# Patient Record
Sex: Female | Born: 1975 | Race: White | Hispanic: No | Marital: Married | Smoking: Current some day smoker
Health system: Southern US, Community
[De-identification: ages and names within clinical notes are randomized; demographics above are authoritative.]

## PROBLEM LIST (undated history)

## (undated) DIAGNOSIS — J45909 Unspecified asthma, uncomplicated: Secondary | ICD-10-CM

## (undated) DIAGNOSIS — F32A Depression, unspecified: Secondary | ICD-10-CM

## (undated) DIAGNOSIS — F329 Major depressive disorder, single episode, unspecified: Secondary | ICD-10-CM

## (undated) DIAGNOSIS — L309 Dermatitis, unspecified: Secondary | ICD-10-CM

## (undated) HISTORY — PX: ABDOMINAL HYSTERECTOMY: SHX81

## (undated) HISTORY — PX: APPENDECTOMY: SHX54

## (undated) HISTORY — PX: CHOLECYSTECTOMY: SHX55

---

## 2014-07-20 ENCOUNTER — Emergency Department (HOSPITAL_COMMUNITY): Payer: Federal, State, Local not specified - PPO

## 2014-07-20 ENCOUNTER — Encounter (HOSPITAL_COMMUNITY): Payer: Self-pay | Admitting: Emergency Medicine

## 2014-07-20 ENCOUNTER — Emergency Department (HOSPITAL_COMMUNITY)
Admission: EM | Admit: 2014-07-20 | Discharge: 2014-07-20 | Disposition: A | Discharge status: Home / Self Care | Payer: Federal, State, Local not specified - PPO | Attending: Emergency Medicine | Admitting: Emergency Medicine

## 2014-07-20 DIAGNOSIS — Z79899 Other long term (current) drug therapy: Secondary | ICD-10-CM | POA: Insufficient documentation

## 2014-07-20 DIAGNOSIS — Z720 Tobacco use: Secondary | ICD-10-CM | POA: Diagnosis not present

## 2014-07-20 DIAGNOSIS — F329 Major depressive disorder, single episode, unspecified: Secondary | ICD-10-CM | POA: Diagnosis not present

## 2014-07-20 DIAGNOSIS — R0602 Shortness of breath: Secondary | ICD-10-CM

## 2014-07-20 DIAGNOSIS — Z789 Other specified health status: Secondary | ICD-10-CM

## 2014-07-20 DIAGNOSIS — Z7952 Long term (current) use of systemic steroids: Secondary | ICD-10-CM | POA: Diagnosis not present

## 2014-07-20 DIAGNOSIS — F419 Anxiety disorder, unspecified: Secondary | ICD-10-CM | POA: Diagnosis not present

## 2014-07-20 DIAGNOSIS — M542 Cervicalgia: Secondary | ICD-10-CM | POA: Diagnosis not present

## 2014-07-20 DIAGNOSIS — Z872 Personal history of diseases of the skin and subcutaneous tissue: Secondary | ICD-10-CM | POA: Diagnosis not present

## 2014-07-20 DIAGNOSIS — Z3202 Encounter for pregnancy test, result negative: Secondary | ICD-10-CM | POA: Diagnosis not present

## 2014-07-20 DIAGNOSIS — J45901 Unspecified asthma with (acute) exacerbation: Secondary | ICD-10-CM | POA: Diagnosis not present

## 2014-07-20 DIAGNOSIS — R079 Chest pain, unspecified: Secondary | ICD-10-CM

## 2014-07-20 DIAGNOSIS — Z880 Allergy status to penicillin: Secondary | ICD-10-CM | POA: Diagnosis not present

## 2014-07-20 HISTORY — DX: Unspecified asthma, uncomplicated: J45.909

## 2014-07-20 HISTORY — DX: Major depressive disorder, single episode, unspecified: F32.9

## 2014-07-20 HISTORY — DX: Dermatitis, unspecified: L30.9

## 2014-07-20 HISTORY — DX: Depression, unspecified: F32.A

## 2014-07-20 LAB — CBC
HEMATOCRIT: 43.8 % (ref 36.0–46.0)
Hemoglobin: 14.1 g/dL (ref 12.0–15.0)
MCH: 30.1 pg (ref 26.0–34.0)
MCHC: 32.2 g/dL (ref 30.0–36.0)
MCV: 93.4 fL (ref 78.0–100.0)
PLATELETS: 427 10*3/uL — AB (ref 150–400)
RBC: 4.69 MIL/uL (ref 3.87–5.11)
RDW: 13.4 % (ref 11.5–15.5)
WBC: 11.5 10*3/uL — AB (ref 4.0–10.5)

## 2014-07-20 LAB — POC URINE PREG, ED: PREG TEST UR: NEGATIVE

## 2014-07-20 LAB — URINALYSIS, ROUTINE W REFLEX MICROSCOPIC
Bilirubin Urine: NEGATIVE
Glucose, UA: NEGATIVE mg/dL
Ketones, ur: NEGATIVE mg/dL
Leukocytes, UA: NEGATIVE
Nitrite: NEGATIVE
PH: 5 (ref 5.0–8.0)
PROTEIN: NEGATIVE mg/dL
Specific Gravity, Urine: 1.026 (ref 1.005–1.030)
Urobilinogen, UA: 0.2 mg/dL (ref 0.0–1.0)

## 2014-07-20 LAB — URINE MICROSCOPIC-ADD ON

## 2014-07-20 LAB — BASIC METABOLIC PANEL
ANION GAP: 5 (ref 5–15)
BUN: 21 mg/dL (ref 6–23)
CHLORIDE: 108 meq/L (ref 96–112)
CO2: 26 mmol/L (ref 19–32)
CREATININE: 0.85 mg/dL (ref 0.50–1.10)
Calcium: 8.8 mg/dL (ref 8.4–10.5)
GFR calc non Af Amer: 87 mL/min — ABNORMAL LOW (ref >90)
Glucose, Bld: 100 mg/dL — ABNORMAL HIGH (ref 70–99)
POTASSIUM: 4 mmol/L (ref 3.5–5.1)
Sodium: 139 mmol/L (ref 135–145)

## 2014-07-20 LAB — RAPID URINE DRUG SCREEN, HOSP PERFORMED
Amphetamines: NOT DETECTED
Barbiturates: NOT DETECTED
Benzodiazepines: NOT DETECTED
Cocaine: NOT DETECTED
OPIATES: NOT DETECTED
Tetrahydrocannabinol: NOT DETECTED

## 2014-07-20 LAB — I-STAT TROPONIN, ED: Troponin i, poc: 0 ng/mL (ref 0.00–0.08)

## 2014-07-20 MED ORDER — IPRATROPIUM BROMIDE 0.02 % IN SOLN
0.5000 mg | Freq: Once | RESPIRATORY_TRACT | Status: AC
  Administered 2014-07-20: 0.5 mg via RESPIRATORY_TRACT
  Filled 2014-07-20: qty 2.5

## 2014-07-20 MED ORDER — LORAZEPAM 1 MG PO TABS: 0.5000 mg | ORAL_TABLET | Freq: Three times a day (TID) | ORAL | Status: AC | PRN

## 2014-07-20 MED ORDER — ALBUTEROL SULFATE (2.5 MG/3ML) 0.083% IN NEBU
5.0000 mg | INHALATION_SOLUTION | Freq: Once | RESPIRATORY_TRACT | Status: AC
  Administered 2014-07-20: 5 mg via RESPIRATORY_TRACT
  Filled 2014-07-20: qty 6

## 2014-07-20 MED ORDER — METHOCARBAMOL 500 MG PO TABS
500.0000 mg | ORAL_TABLET | Freq: Two times a day (BID) | ORAL | Status: DC
Start: 2014-07-20 — End: 2014-07-20

## 2014-07-20 MED ORDER — LORAZEPAM 2 MG/ML IJ SOLN
1.0000 mg | Freq: Once | INTRAMUSCULAR | Status: AC
  Administered 2014-07-20: 1 mg via INTRAVENOUS
  Filled 2014-07-20: qty 1

## 2014-07-20 MED ORDER — IOHEXOL 350 MG/ML SOLN
100.0000 mL | Freq: Once | INTRAVENOUS | Status: AC | PRN
  Administered 2014-07-20: 100 mL via INTRAVENOUS

## 2014-07-20 MED ORDER — METHOCARBAMOL 500 MG PO TABS: 500.0000 mg | ORAL_TABLET | Freq: Two times a day (BID) | ORAL | Status: AC

## 2014-07-20 MED ORDER — HYDROMORPHONE HCL 1 MG/ML IJ SOLN
1.0000 mg | Freq: Once | INTRAMUSCULAR | Status: AC
Start: 2014-07-20 — End: 2014-07-20
  Administered 2014-07-20: 1 mg via INTRAVENOUS
  Filled 2014-07-20: qty 1

## 2014-07-20 MED ORDER — LORAZEPAM 1 MG PO TABS: 0.5000 mg | ORAL_TABLET | Freq: Three times a day (TID) | ORAL | Status: DC | PRN

## 2014-07-20 NOTE — Discharge Instructions (Signed)
1. Medications: robaxin, ativan, usual home medications 2. Treatment: rest, drink plenty of fluids,  3. Follow Up: Please followup with your primary doctor as soon as she returned home for discussion of your diagnoses and further evaluation after today's visit; if you do not have a primary care doctor use the resource guide provided to find one; Please return to the ER for chest pain that is associated with diaphoresis, nausea, vomiting, syncope or other concerning symptoms.    Chest Pain (Nonspecific) It is often hard to give a specific diagnosis for the cause of chest pain. There is always a chance that your pain could be related to something serious, such as a heart attack or a blood clot in the lungs. You need to follow up with your health care provider for further evaluation. CAUSES   Heartburn.  Pneumonia or bronchitis.  Anxiety or stress.  Inflammation around your heart (pericarditis) or lung (pleuritis or pleurisy).  A blood clot in the lung.  A collapsed lung (pneumothorax). It can develop suddenly on its own (spontaneous pneumothorax) or from trauma to the chest.  Shingles infection (herpes zoster virus). The chest wall is composed of bones, muscles, and cartilage. Any of these can be the source of the pain.  The bones can be bruised by injury.  The muscles or cartilage can be strained by coughing or overwork.  The cartilage can be affected by inflammation and become sore (costochondritis). DIAGNOSIS  Lab tests or other studies may be needed to find the cause of your pain. Your health care provider may have you take a test called an ambulatory electrocardiogram (ECG). An ECG records your heartbeat patterns over a 24-hour period. You may also have other tests, such as:  Transthoracic echocardiogram (TTE). During echocardiography, sound waves are used to evaluate how blood flows through your heart.  Transesophageal echocardiogram (TEE).  Cardiac monitoring. This allows  your health care provider to monitor your heart rate and rhythm in real time.  Holter monitor. This is a portable device that records your heartbeat and can help diagnose heart arrhythmias. It allows your health care provider to track your heart activity for several days, if needed.  Stress tests by exercise or by giving medicine that makes the heart beat faster. TREATMENT   Treatment depends on what may be causing your chest pain. Treatment may include:  Acid blockers for heartburn.  Anti-inflammatory medicine.  Pain medicine for inflammatory conditions.  Antibiotics if an infection is present.  You may be advised to change lifestyle habits. This includes stopping smoking and avoiding alcohol, caffeine, and chocolate.  You may be advised to keep your head raised (elevated) when sleeping. This reduces the chance of acid going backward from your stomach into your esophagus. Most of the time, nonspecific chest pain will improve within 2-3 days with rest and mild pain medicine.  HOME CARE INSTRUCTIONS   If antibiotics were prescribed, take them as directed. Finish them even if you start to feel better.  For the next few days, avoid physical activities that bring on chest pain. Continue physical activities as directed.  Do not use any tobacco products, including cigarettes, chewing tobacco, or electronic cigarettes.  Avoid drinking alcohol.  Only take medicine as directed by your health care provider.  Follow your health care provider's suggestions for further testing if your chest pain does not go away.  Keep any follow-up appointments you made. If you do not go to an appointment, you could develop lasting (chronic) problems with pain.  If there is any problem keeping an appointment, call to reschedule. SEEK MEDICAL CARE IF:   Your chest pain does not go away, even after treatment.  You have a rash with blisters on your chest.  You have a fever. SEEK IMMEDIATE MEDICAL CARE IF:    You have increased chest pain or pain that spreads to your arm, neck, jaw, back, or abdomen.  You have shortness of breath.  You have an increasing cough, or you cough up blood.  You have severe back or abdominal pain.  You feel nauseous or vomit.  You have severe weakness.  You faint.  You have chills. This is an emergency. Do not wait to see if the pain will go away. Get medical help at once. Call your local emergency services (911 in [place] Do not drive yourself to the hospital. MAKE SURE YOU:   Understand these instructions.  Will watch your condition.  Will get help right away if you are not doing well or get worse. Document Released: 04/12/2005 Document Revised: 07/08/2013 Document Reviewed: 02/06/2008 [facility] Patient Information 2015 [place], [place]. This information is not intended to replace advice given to you by your health care provider. Make sure you discuss any questions you have with your health care provider.

## 2014-07-20 NOTE — ED Notes (Signed)
Patient transported to X-ray 

## 2014-07-20 NOTE — ED Provider Notes (Signed)
CSN: [id number]     Arrival date & time 07/20/14  1521 History   First MD Initiated Contact with Patient 07/20/14 1704     Chief Complaint  Patient presents with  . Chest Pain  . Neck Pain  . Shortness of Breath     (Consider location/radiation/quality/duration/timing/severity/associated sxs/prior Treatment) Patient is a 39 y.o. female presenting with chest pain, neck pain, and shortness of breath. The history is provided by the patient and medical records. No language interpreter was used.  Chest Pain Associated symptoms: shortness of breath   Associated symptoms: no abdominal pain, no back pain, no cough, no diaphoresis, no fatigue, no fever, no headache, no nausea and not vomiting   Neck Pain Associated symptoms: chest pain   Associated symptoms: no fever and no headaches   Shortness of Breath Associated symptoms: chest pain and neck pain (left-sided)   Associated symptoms: no abdominal pain, no cough, no diaphoresis, no fever, no headaches, no rash, no vomiting and no wheezing      Monica Baker is a 39 y.o. female  with a hx of eczema, asthma, depression presents to the Emergency Department complaining of brief, intermittent episodes of chest pain described as sharp with radiation to the left neck but not down the left arm. She reports approximately 3-4 episodes per hour lasting approximately 10 seconds to 1 minute each. She reports that they're associated with shortness of breath but no other symptoms. She denies diaphoresis, nausea, lightheadedness, syncope, abdominal pain or vomiting. Patient reports that 3 days ago her grandmother died and this has made her sad. She also reports that her father's new wife is causing her significant amounts of stress. Patient reports that 6 days ago she flew from [place] here in [place]. She reports she is a smoker, smoking approximately 1 pack per week. Patient denies exogenous estrogen, recent surgeries or broken bones. 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 She reports she is no longer on blood thinners. Patient reports she's never had chest pain like this before. She reports that it is different from previous episodes of asthma exacerbation and she has used her albuterol at home without relief. Nothing seems to make the symptoms better or worse. She denies fever, chills, headache, abdominal pain, nausea, vomiting, diarrhea, weakness, dizziness, syncope, dysuria, hematuria.    Past Medical History  Diagnosis Date  . Asthma   . Eczema   . Depression    Past Surgical History  Procedure Laterality Date  . Abdominal hysterectomy    . Cholecystectomy    . Appendectomy     No family history on file. History  Substance Use Topics  . Smoking status: Current Some Day Smoker  . Smokeless tobacco: Not on file  . Alcohol Use: Yes   OB History    No data available     Review of Systems  Constitutional: Negative for fever, diaphoresis, appetite change, fatigue and unexpected weight change.  HENT: Negative for mouth sores.   Eyes: Negative for visual disturbance.  Respiratory: Positive for shortness of breath. Negative for cough, chest tightness and wheezing.   Cardiovascular: Positive for chest pain.  Gastrointestinal: Negative for nausea, vomiting, abdominal pain, diarrhea and constipation.  Endocrine: Negative for polydipsia, polyphagia and polyuria.  Genitourinary: Negative for dysuria, urgency, frequency and hematuria.  Musculoskeletal: Positive for neck pain (left-sided). Negative for back pain and neck stiffness.  Skin: Negative for rash.  Allergic/Immunologic: Negative for  immunocompromised state.  Neurological: Negative for syncope, light-headedness and headaches.  Hematological: Does not bruise/bleed easily.  Psychiatric/Behavioral: Negative for sleep disturbance. The patient is not nervous/anxious.       Allergies  Clindamycin/lincomycin;  Morphine and related; Penicillins; Red dye; Reglan; and Talwin  Home Medications   Prior to Admission medications   Medication Sig Start Date End Date Taking? Authorizing Provider  cetirizine (ZYRTEC) 10 MG tablet Take 10 mg by mouth 2 (two) times daily.   Yes Historical Provider, MD  lithium carbonate 300 MG capsule Take 300-600 mg by mouth 2 (two) times daily with a meal. 300 mg in am and 60 mg in the evening   Yes Historical Provider, MD  predniSONE (DELTASONE) 20 MG tablet Take 40 mg by mouth daily with breakfast.   Yes Historical Provider, MD  thyroid (ARMOUR) 32.5 MG tablet Take 32.5 mg by mouth daily.   Yes Historical Provider, MD  venlafaxine XR (EFFEXOR-XR) 37.5 MG 24 hr capsule Take 37.5 mg by mouth daily with breakfast.   Yes Historical Provider, MD  LORazepam (ATIVAN) 1 MG tablet Take 0.5-1 tablets (0.5-1 mg total) by mouth every 8 (eight) hours as needed for anxiety. 07/20/14   Nancylee Gaines, PA-C  methocarbamol (ROBAXIN) 500 MG tablet Take 1 tablet (500 mg total) by mouth 2 (two) times daily. 07/20/14   Nikira Kushnir, PA-C   BP 139/78 mmHg  Pulse 71  Temp(Src) 99.1 F (37.3 C) (Oral)  Resp 18  SpO2 100% Physical Exam  Constitutional: She is oriented to person, place, and time. She appears well-developed and well-nourished. No distress.  Awake, alert, nontoxic appearance  HENT:  Head: Normocephalic and atraumatic.  Right Ear: Tympanic membrane, external ear and ear canal normal.  Left Ear: Tympanic membrane, external ear and ear canal normal.  Nose: Nose normal. No epistaxis. Right sinus exhibits no maxillary sinus tenderness and no frontal sinus tenderness. Left sinus exhibits no maxillary sinus tenderness and no frontal sinus tenderness.  Mouth/Throat: Uvula is midline, oropharynx is clear and moist and mucous membranes are normal. Mucous membranes are not pale and not cyanotic. No oropharyngeal exudate, posterior oropharyngeal edema, posterior oropharyngeal  erythema or tonsillar abscesses.  Eyes: Conjunctivae are normal. Pupils are equal, round, and reactive to light. No scleral icterus.  Neck: Normal range of motion and full passive range of motion without pain. Neck supple.  Cardiovascular: Normal rate, regular rhythm, normal heart sounds and intact distal pulses.   No murmur heard. Pulmonary/Chest: Effort normal and breath sounds normal. No stridor. No respiratory distress. She has no wheezes. She exhibits no tenderness.  Equal chest expansion Clear and equal breath sounds No reproducible tenderness to the anterior chest or wrists  Abdominal: Soft. Bowel sounds are normal. She exhibits no distension and no mass. There is no tenderness. There is no rebound and no guarding.  Obese Soft and nontender  Musculoskeletal: Normal range of motion. She exhibits no edema.  Lymphadenopathy:    She has no cervical adenopathy.  Neurological: She is alert and oriented to person, place, and time. She exhibits normal muscle tone. Coordination normal.  Speech is clear and goal oriented Moves extremities without ataxia  Skin: Skin is warm and dry. No rash noted. She is not diaphoretic. No erythema.  Psychiatric: She exhibits a depressed mood. She expresses no homicidal and no suicidal ideation.  Patient is tearful  Nursing note and vitals reviewed.   ED Course  Procedures (including critical care time) Labs Review Labs Reviewed  BASIC METABOLIC  PANEL - Abnormal; Notable for the following:    Glucose, Bld 100 (*)    GFR calc non Af Amer 87 (*)    All other components within normal limits  CBC - Abnormal; Notable for the following:    WBC 11.5 (*)    Platelets 427 (*)    All other components within normal limits  URINALYSIS, ROUTINE W REFLEX MICROSCOPIC - Abnormal; Notable for the following:    APPearance CLOUDY (*)    Hgb urine dipstick LARGE (*)    All other components within normal limits  URINE MICROSCOPIC-ADD ON - Abnormal; Notable for the  following:    Bacteria, UA FEW (*)    Crystals CA OXALATE CRYSTALS (*)    All other components within normal limits  URINE RAPID DRUG SCREEN (HOSP PERFORMED)  I-STAT TROPOININ, ED  POC URINE PREG, ED    Imaging Review Dg Chest 2 View  07/20/2014   CLINICAL DATA:  Shortness of breath, productive cough, recent upper respiratory infection  EXAM: CHEST  2 VIEW  COMPARISON:  None.  FINDINGS: Cardiomediastinal silhouette is unremarkable. No acute infiltrate or pulmonary edema. Central mild bronchitic changes. Mild degenerative changes mid thoracic spine.  IMPRESSION: No acute infiltrate or pulmonary edema. Central mild bronchitic changes.   Electronically Signed   By: Natasha Mead M.D.   On: 07/20/2014 17:14   Ct Angio Chest Pe W/cm &/or Wo Cm  07/20/2014   CLINICAL DATA:  Chest pain and shortness of breath. History of recent travel.  EXAM: CT ANGIOGRAPHY CHEST WITH CONTRAST  TECHNIQUE: Multidetector CT imaging of the chest was performed using the standard protocol during bolus administration of intravenous contrast. Multiplanar CT image reconstructions and MIPs were obtained to evaluate the vascular anatomy.  CONTRAST:  OMNIPAQUE IOHEXOL 350 MG/ML SOLN  COMPARISON:  Chest radiograph earlier the same day  FINDINGS: There are no filling defects within the pulmonary arteries to suggest pulmonary embolus. The subsegmental branches are suboptimally assessed due to contrast bolus timing. There is no evidence of right heart strain. The thoracic aorta is normal in caliber without evidence of dissection.  Mild bronchial thickening. The lungs are clear. No consolidation, pulmonary nodule or mass.  No pleural or pericardial effusion. There is no mediastinal or hilar adenopathy. The heart size is normal.  No acute abnormality in the included upper abdomen. There is an elongated left hepatic lobe. Two peripherally enhancing lesions in the liver, 1.6 cm lesion in the left hepatic lobe and 2.5 cm lesion in the  periportal right hepatic lobe are most consistent with hemangiomas. Patient is postcholecystectomy.  No acute osseous abnormality. There is mild multilevel degenerative change in the thoracic spine with endplate spurring and small Schmorl's nodes.  Review of the MIP images confirms the above findings.  IMPRESSION: 1. No pulmonary embolus. 2. Mild bronchial thickening. This can be seen with bronchitis or asthma. 3. Two peripherally enhancing lesions in the liver most consistent with hepatic hemangiomas.   Electronically Signed   By: Rubye Oaks M.D.   On: 07/20/2014 19:12     EKG Interpretation   Date/Time:  Monday July 20 2014 15:33:42 EST Ventricular Rate:  89 PR Interval:  116 QRS Duration: 90 QT Interval:  376 QTC Calculation: 457 R Axis:   72 Text Interpretation:  Normal sinus rhythm Normal ECG Confirmed by Lincoln Brigham [phone]) on 07/21/2014 2:07:47 AM        MDM   Final diagnoses:  SOB (shortness of breath)  Chest  pain  History of recent travel  Anxiety   Monica Baker   presents to the emergency department complaining of chest pain and shortness of breath. Patient is without tachycardia however her recent travel history in addition to smoking and history of DVT makes her moderate risk for PE. Labs reassuring. No evidence of pneumonia on her chest x-ray. Patient without cardiac history. Clear and equal breath sounds. CT angiogram chest pending.  Patient is tearful about the events of the last week or so. I suspect her chest pain is more likely secondary to her anxiety. She asked if this is possible and if her scan is negative if we can help her with her anxiety.   8:58 PM Labs reassuring. Patient without evidence of urinary tract infection.  Chest pain is not likely of cardiac or pulmonary etiology d/t presentation, VSS, no tracheal deviation, no JVD or new murmur, RRR, breath sounds equal bilaterally, EKG without acute abnormalities, negative troponin, and negative CXR.  Patient also with negative CT angio of chest. Patient with slight improvement after albuterol and Atrovent administration however no wheezing on initial exam to indicate that this is an asthma exacerbation. Patient chest pain is likely secondary to her anxiety. Patient with improved symptoms after Ativan administration. Pt has been advised to return to the ED if CP becomes exertional, associated with diaphoresis or nausea, radiates to left jaw/arm, worsens or becomes concerning in any way. Pt appears reliable for follow up and is agreeable to discharge.   I have personally reviewed patient's vitals, nursing note and any pertinent labs or imaging.  I performed an undressed physical exam.    It has been determined that no acute conditions requiring further emergency intervention are present at this time. The patient/guardian have been advised of the diagnosis and plan. I reviewed all labs and imaging including any potential incidental findings. We have discussed signs and symptoms that warrant return to the ED and they are listed in the discharge instructions.    Vital signs are stable at discharge.   BP 139/78 mmHg  Pulse 71  Temp(Src) 99.1 F (37.3 C) (Oral)  Resp 18  SpO2 100%          Dierdre Forth, PA-C 07/21/14 0235  Tilden Fossa, MD 07/21/14 [phone]

## 2014-07-20 NOTE — ED Notes (Signed)
Pt states that she started having chest pain last night that radiates in to her L neck that is "throbbing." Also feels SOB. Hx of asthma. Hypertensive.

## 2016-02-29 IMAGING — CT CT ANGIO CHEST
2 of 7 series · 19 of 37 positions shown · IV contrast (OMNIPAQUE)
Comparison: Chest radiograph earlier the same day

CLINICAL DATA: Chest pain and shortness of breath. History of
recent travel.

EXAM:
CT ANGIOGRAPHY CHEST WITH CONTRAST
TECHNIQUE: Multidetector CT imaging of the chest was performed using the
standard protocol during bolus administration of intravenous
contrast. Multiplanar CT image reconstructions and MIPs were
obtained to evaluate the vascular anatomy.
CONTRAST:  100mL OMNIPAQUE IOHEXOL 350 MG/ML SOLN

[Series 8: pe thins @ 1mm · axial · 0.68mm/px · z∈[-374,-92]mm · 18 of 314 slices shown]
[im 16/314  lung]
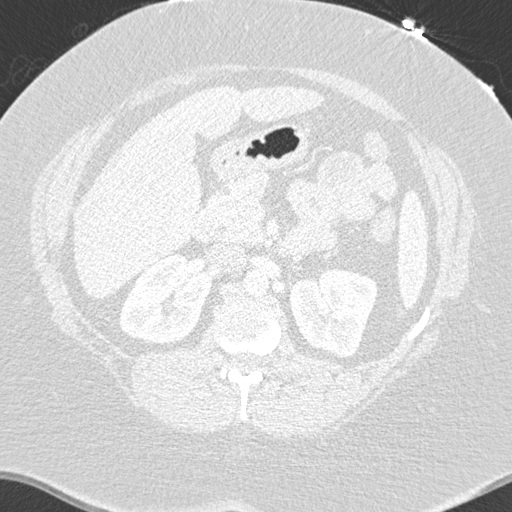
[im 32/314  mediastinal]
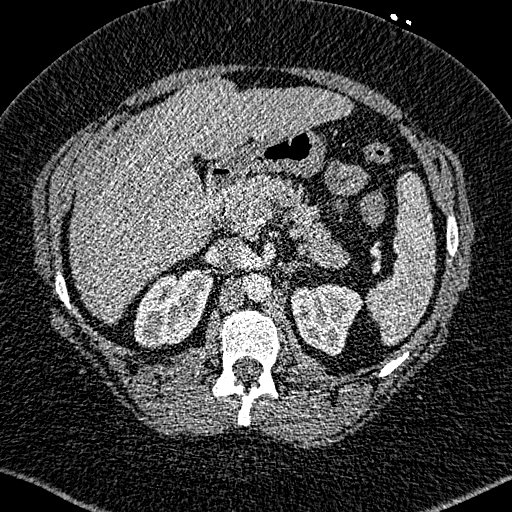
[im 47/314  lung]
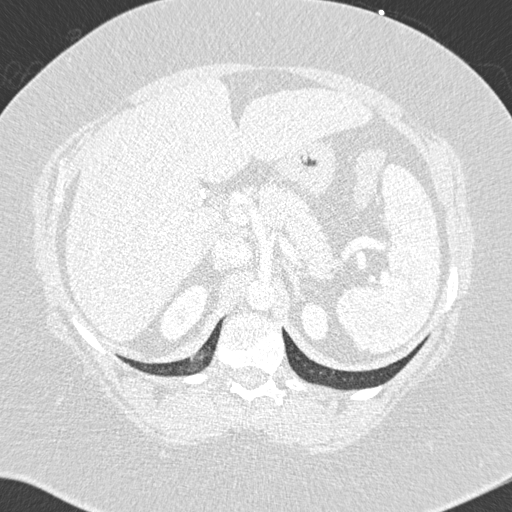
[im 63/314  mediastinal]
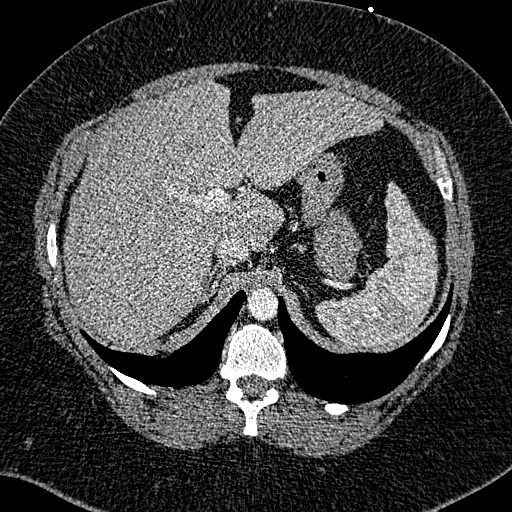
[im 79/314  lung]
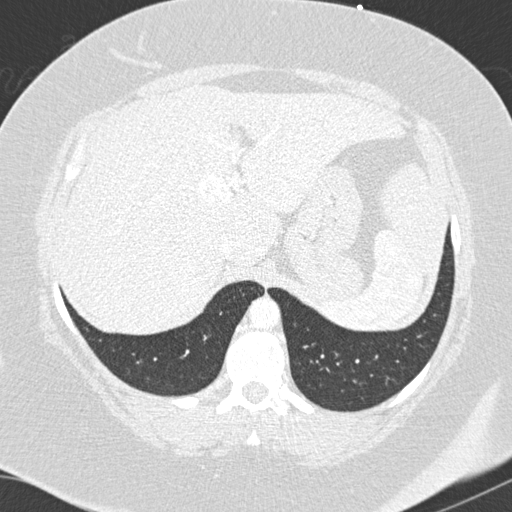
[im 94/314  mediastinal]
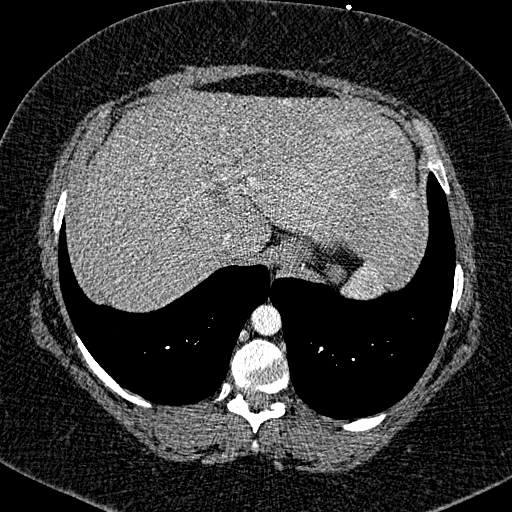
[im 110/314  lung]
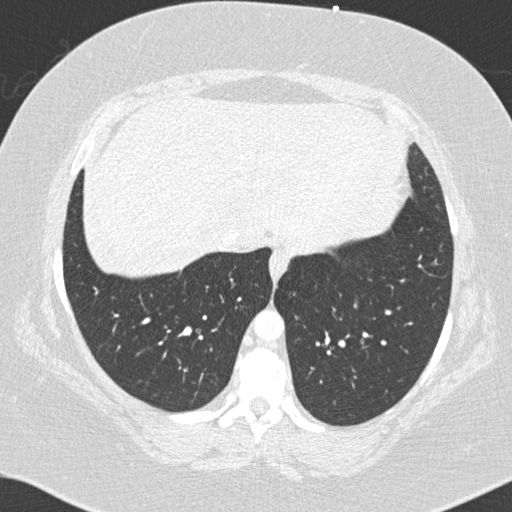
[im 126/314  mediastinal]
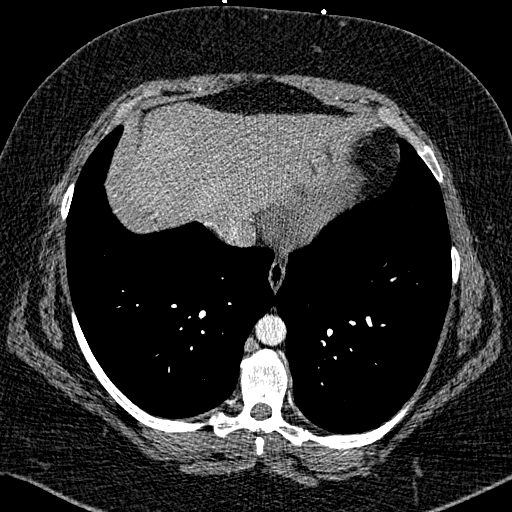
[im 141/314  lung]
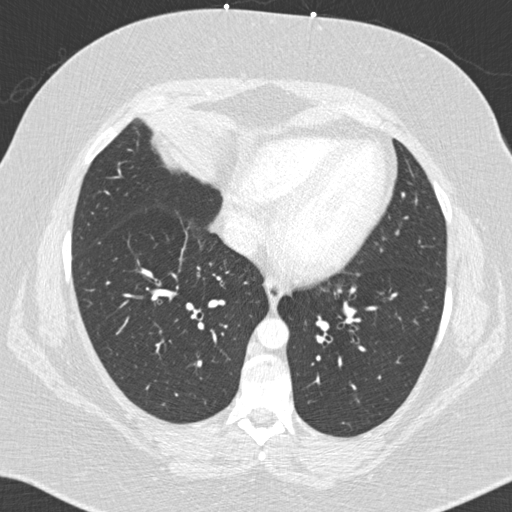
[im 173/314  mediastinal]
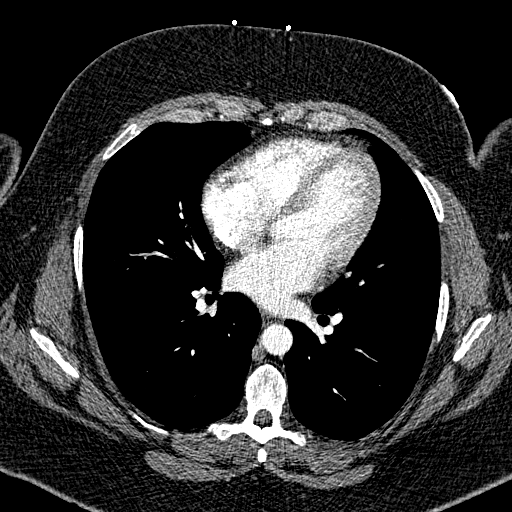
[im 188/314  lung]
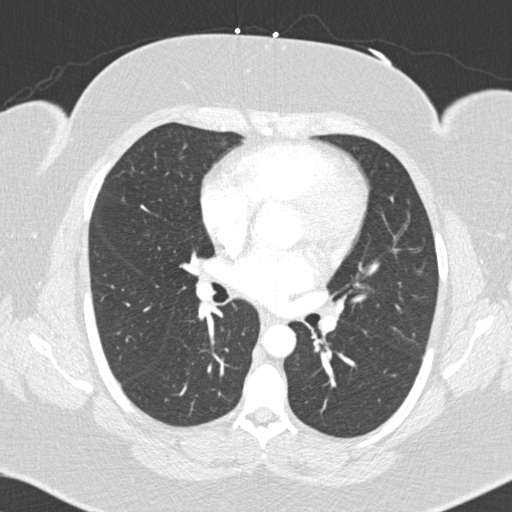
[im 204/314  mediastinal]
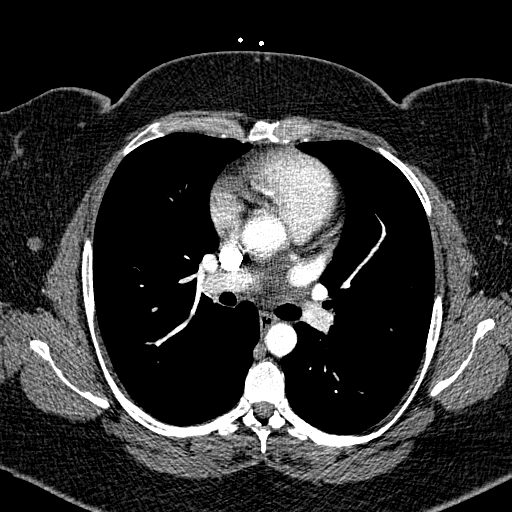
[im 220/314  lung]
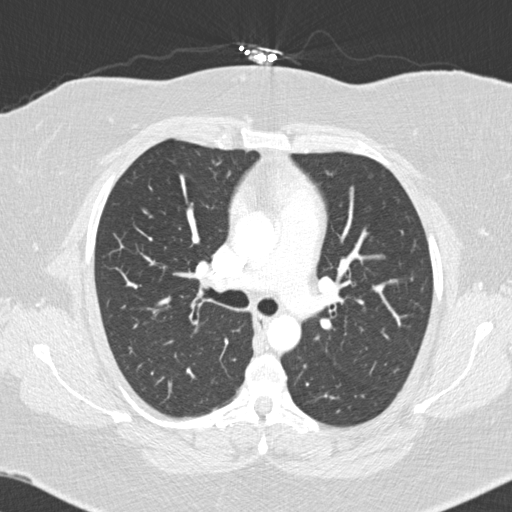
[im 235/314  mediastinal]
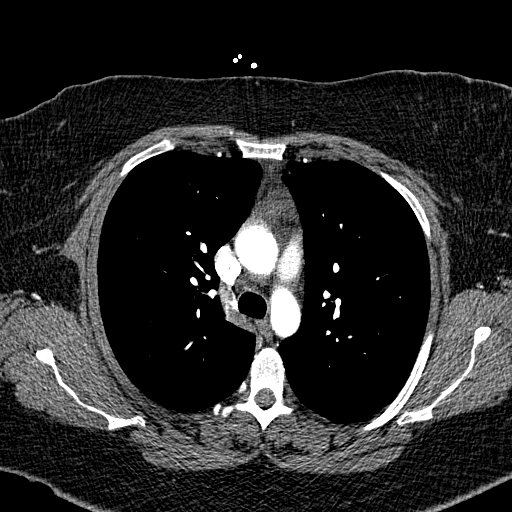
[im 251/314  lung]
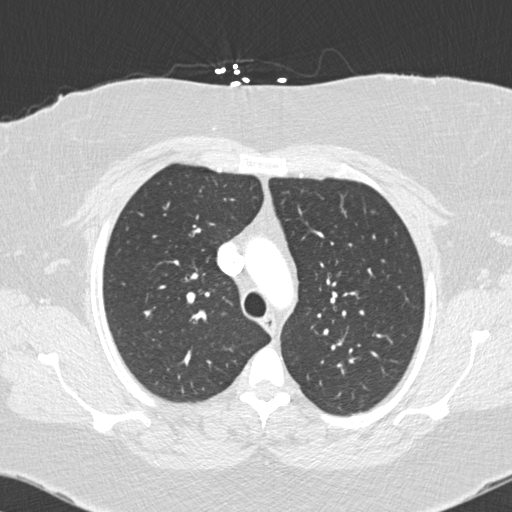
[im 267/314  mediastinal]
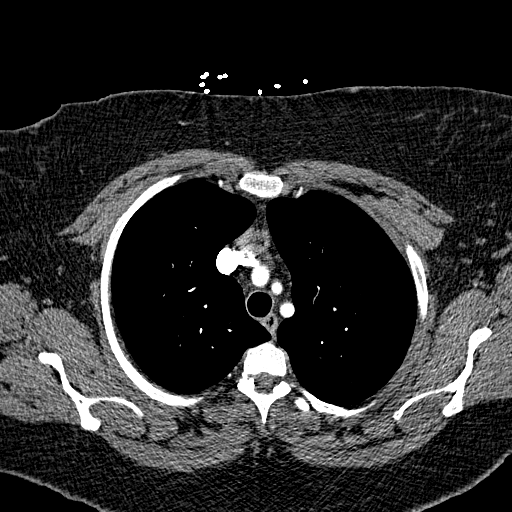
[im 282/314  lung]
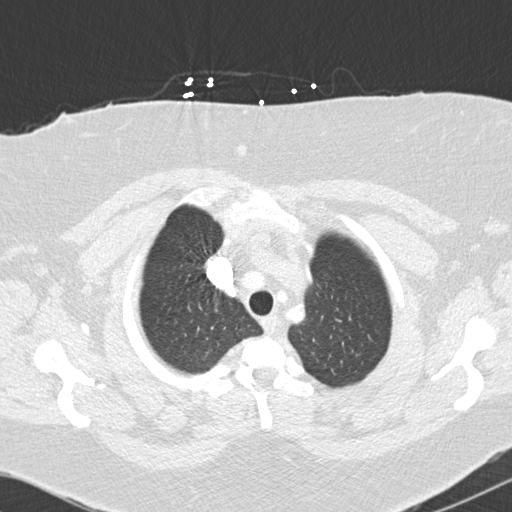
[im 298/314  mediastinal]
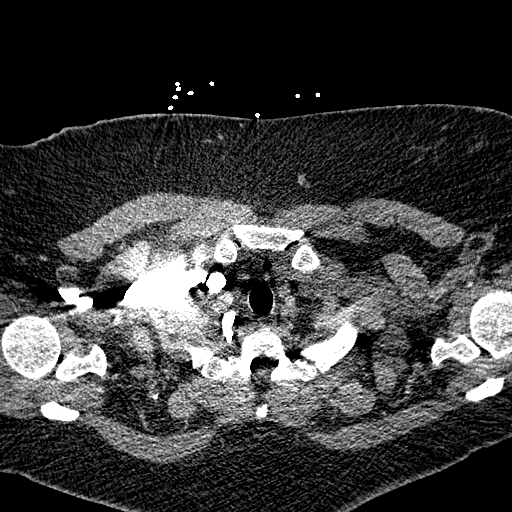

[Series 602: <mpr thick range> · coronal · 0.68mm/px · 1 of 174 slices shown]
[im 87/174  mediastinal]
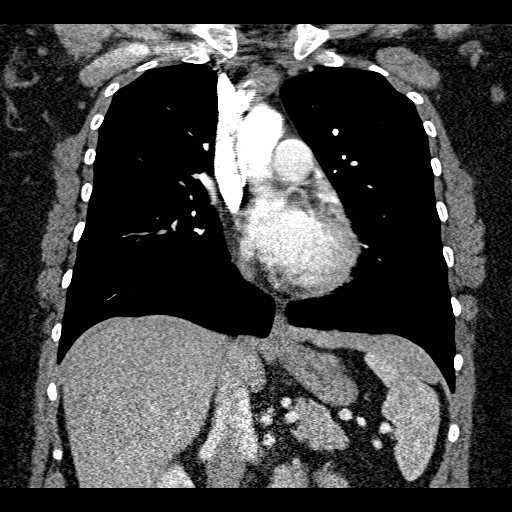

[19 of 37 positions shown; findings below may reference images not displayed]

FINDINGS: There are no filling defects within the pulmonary arteries to
suggest pulmonary embolus. The subsegmental branches are
suboptimally assessed due to contrast bolus timing. There is no
evidence of right heart strain. The thoracic aorta is normal in
caliber without evidence of dissection.

Mild bronchial thickening. The lungs are clear. No consolidation,
pulmonary nodule or mass.

No pleural or pericardial effusion. There is no mediastinal or hilar
adenopathy. The heart size is normal.

No acute abnormality in the included upper abdomen. There is an
elongated left hepatic lobe. Two peripherally enhancing lesions in
the liver, 1.6 cm lesion in the left hepatic lobe and 2.5 cm lesion
in the periportal right hepatic lobe are most consistent with
hemangiomas. Patient is postcholecystectomy.

No acute osseous abnormality. There is mild multilevel degenerative
change in the thoracic spine with endplate spurring and small
Schmorl's nodes.

Review of the MIP images confirms the above findings.
IMPRESSION: 1. No pulmonary embolus.
2. Mild bronchial thickening. This can be seen with bronchitis or
asthma.
3. Two peripherally enhancing lesions in the liver most consistent
with hepatic hemangiomas.

## 2016-02-29 IMAGING — CR DG CHEST 2V
2 series · 2 of 2 positions shown · non-contrast
Comparison: None.

CLINICAL DATA: Shortness of breath, productive cough, recent upper
respiratory infection

EXAM:
CHEST  2 VIEW

[w chest pa]
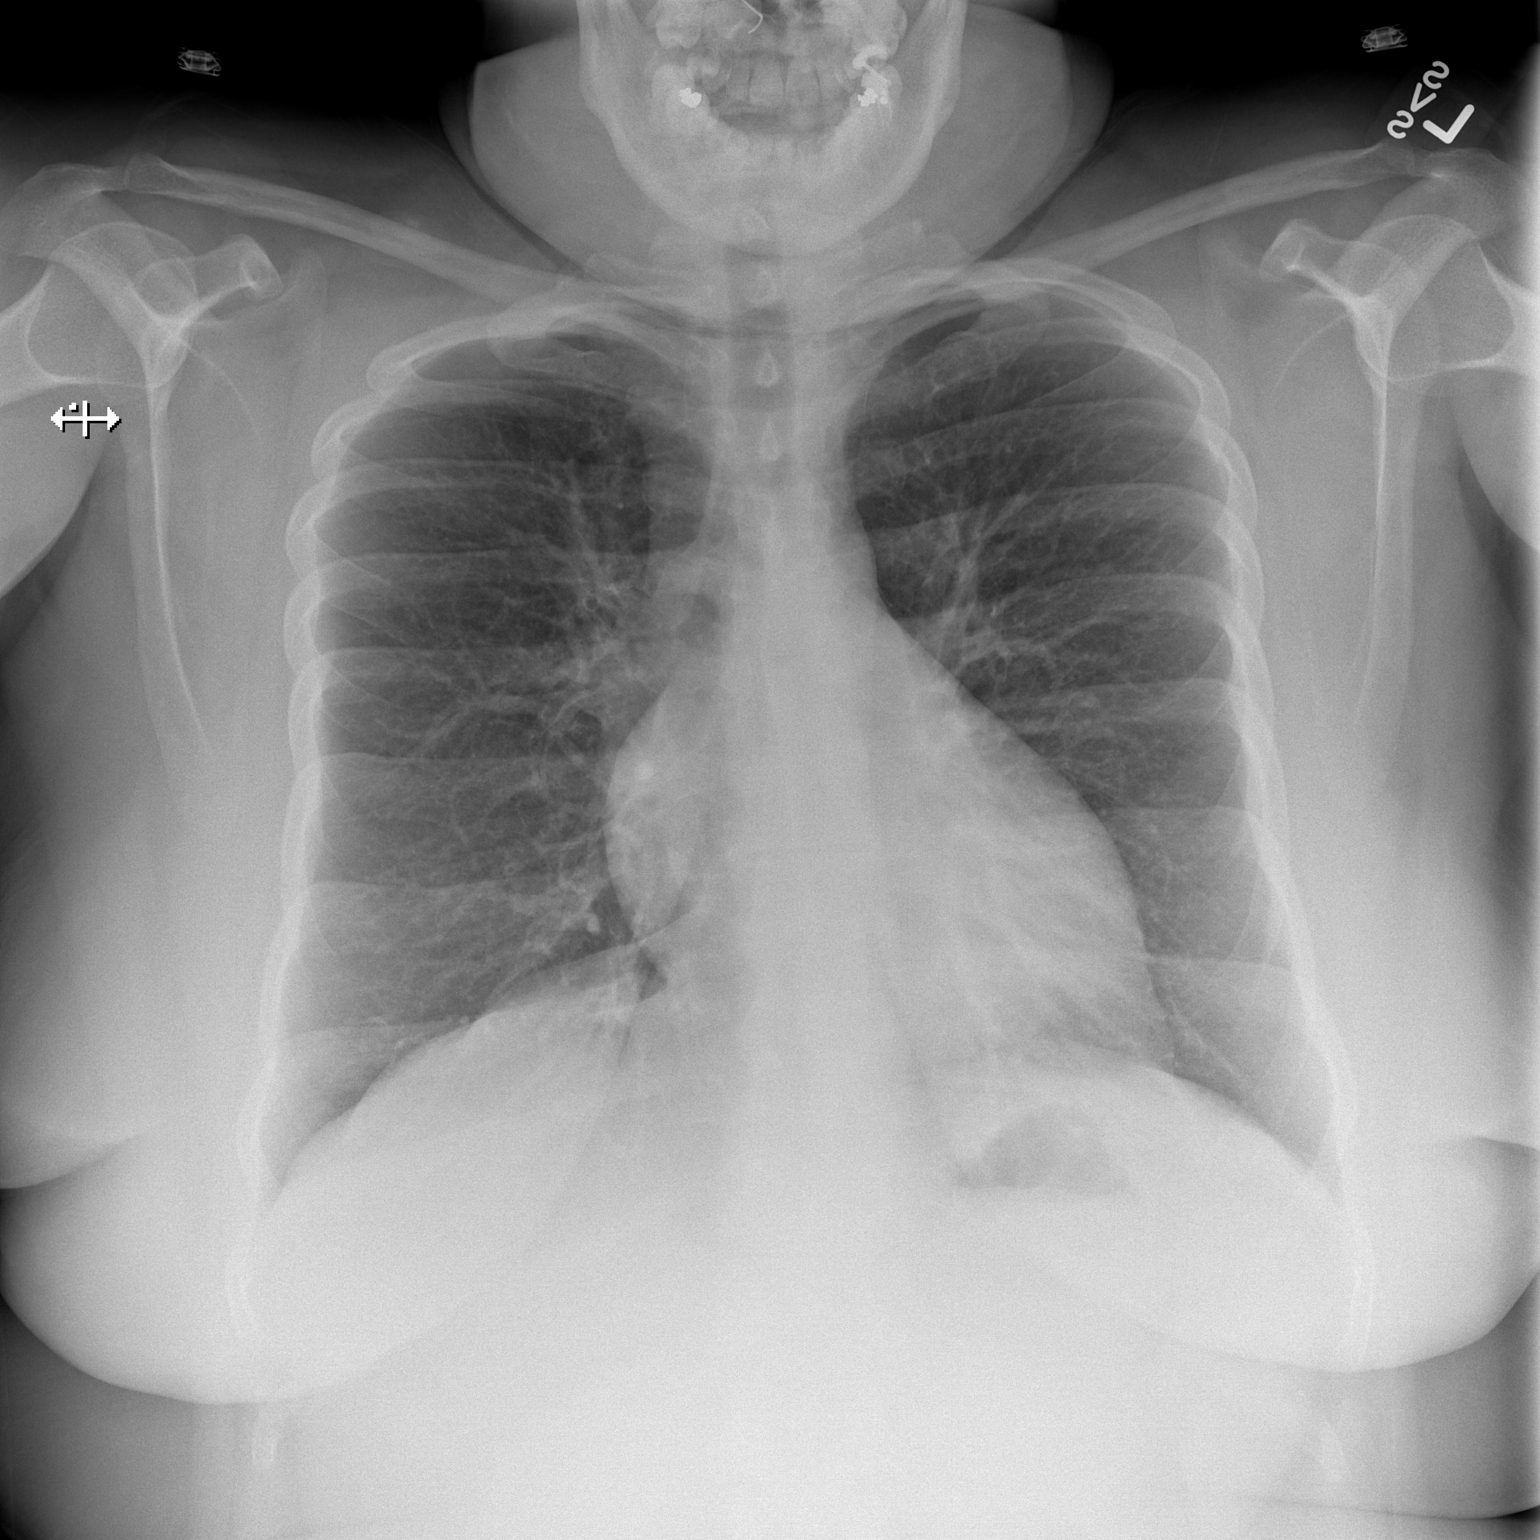

[w chest lat]
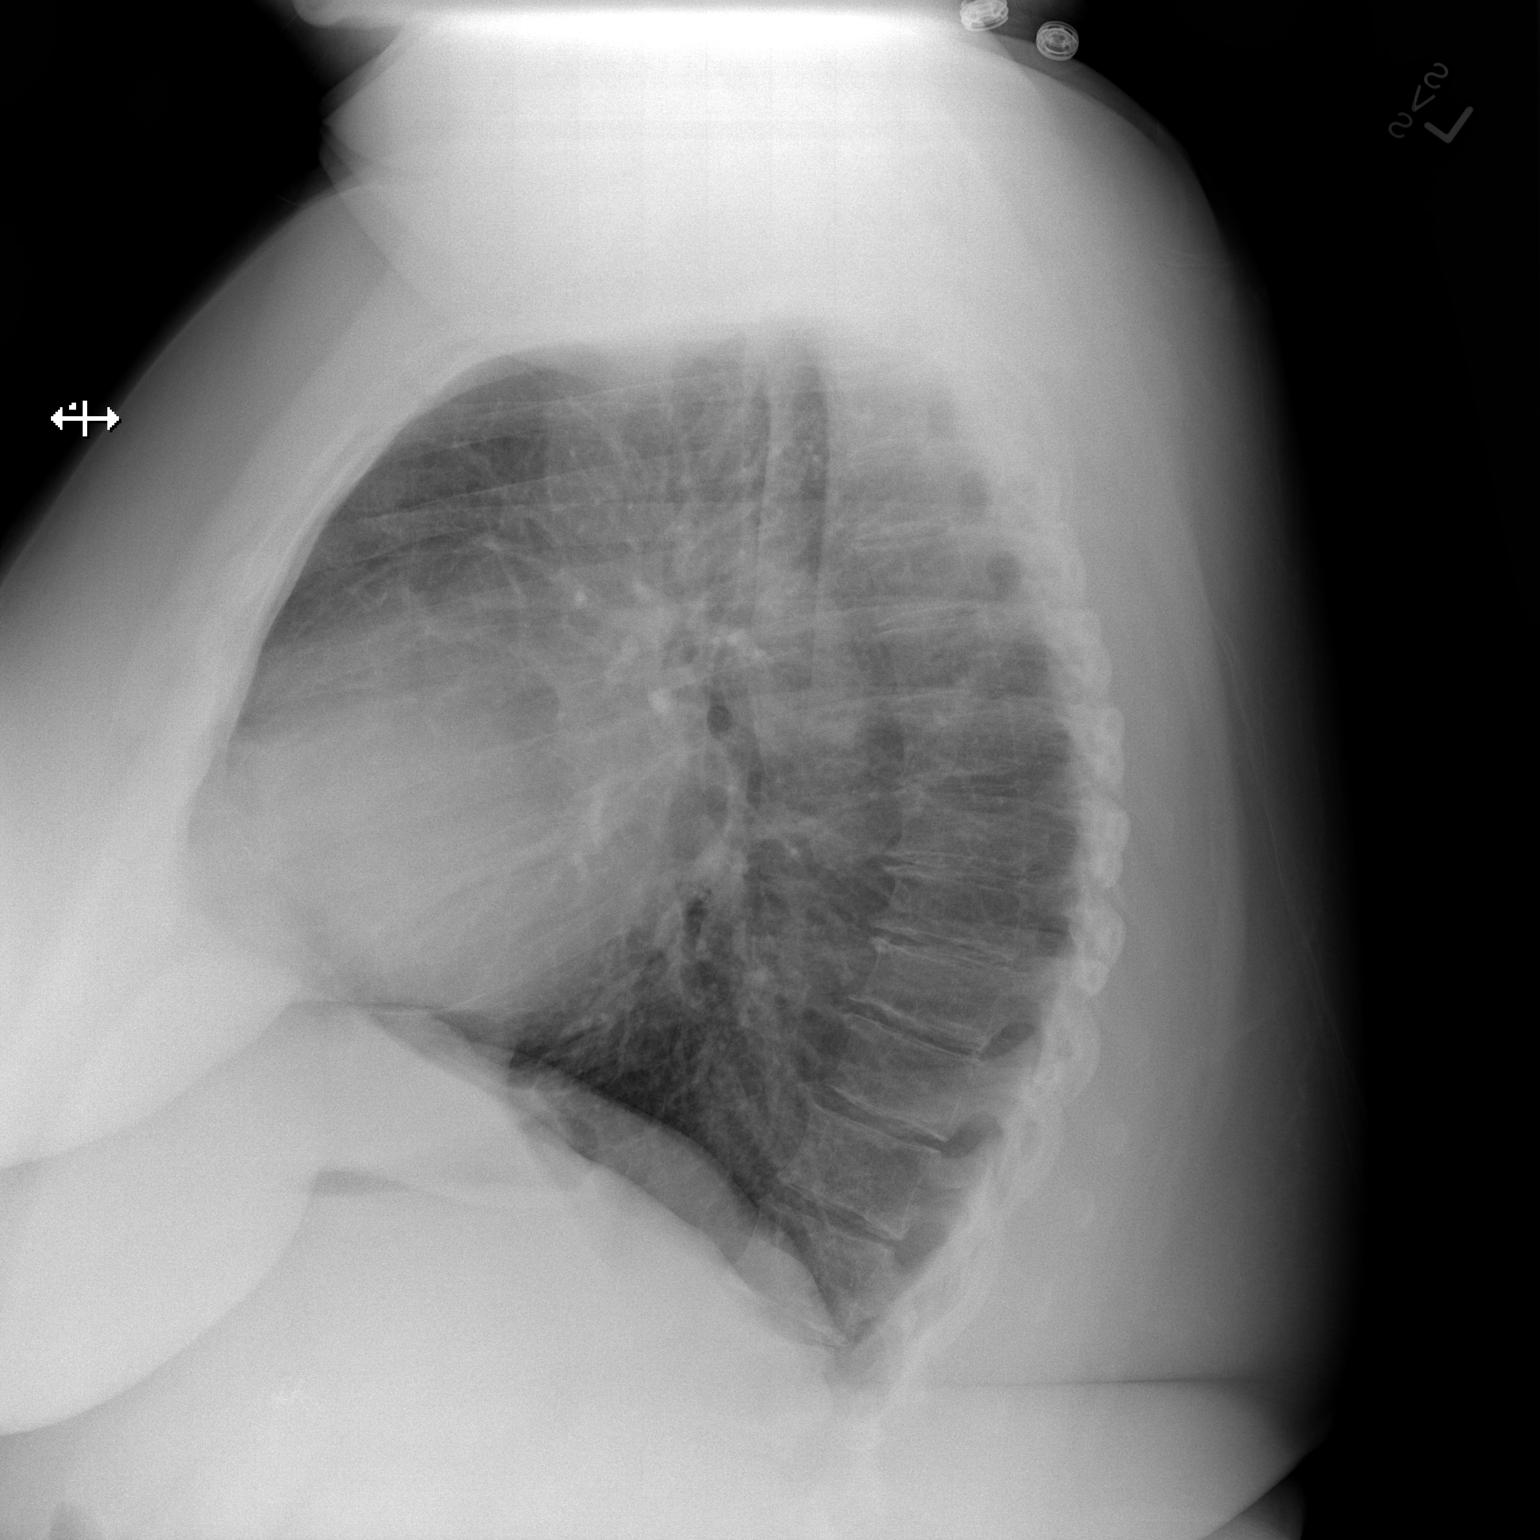

[2 of 2 positions shown; findings below may reference images not displayed]

FINDINGS: Cardiomediastinal silhouette is unremarkable. No acute infiltrate or
pulmonary edema. Central mild bronchitic changes. Mild degenerative
changes mid thoracic spine.
IMPRESSION: No acute infiltrate or pulmonary edema. Central mild bronchitic
changes.
